# Patient Record
Sex: Female | Born: 2000
Health system: Southern US, Community
[De-identification: ages and names within clinical notes are randomized; demographics above are authoritative.]

## PROBLEM LIST (undated history)

## (undated) DIAGNOSIS — Z789 Other specified health status: Secondary | ICD-10-CM

## (undated) HISTORY — DX: Other specified health status: Z78.9

## (undated) HISTORY — PX: NO PAST SURGERIES: SHX2092

---
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
## 2019-02-16 ENCOUNTER — Telehealth: Payer: Self-pay | Admitting: Women's Health

## 2019-02-16 NOTE — Telephone Encounter (Signed)
Unable to reach pt or leave message with pt with restrictions for upcoming appt.

## 2019-02-17 ENCOUNTER — Ambulatory Visit: Payer: Self-pay | Admitting: Adult Health

## 2019-02-17 ENCOUNTER — Ambulatory Visit: Payer: Self-pay | Admitting: Women's Health

## 2019-02-23 ENCOUNTER — Other Ambulatory Visit: Payer: Self-pay

## 2019-02-23 ENCOUNTER — Ambulatory Visit (INDEPENDENT_AMBULATORY_CARE_PROVIDER_SITE_OTHER): Payer: 59 | Admitting: Adult Health

## 2019-02-23 ENCOUNTER — Encounter: Payer: Self-pay | Admitting: Adult Health

## 2019-02-23 VITALS — BP 126/78 | HR 76 | Ht 67.0 in | Wt 133.0 lb

## 2019-02-23 DIAGNOSIS — Z3041 Encounter for surveillance of contraceptive pills: Secondary | ICD-10-CM | POA: Diagnosis not present

## 2019-02-23 NOTE — Progress Notes (Signed)
  Subjective:     Patient ID: Tammy Odonnell, female   DOB: 02/01/2001, 18 y.o.   MRN: [id number]  HPI Fantasha is a 18 year old white female, single, G0P0, back in follow up on starting Lo Loestrin in July and she is happy with them.  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 PCP is Dr Hilma Favors.   Review of Systems Patient denies any headaches, hearing loss, fatigue, blurred vision, shortness of breath, chest pain, abdominal pain, problems with bowel movements, urination, or intercourse. No joint pain or mood swings.  Reviewed past medical,surgical, social and family history. Reviewed medications and allergies.     Objective:   Physical Exam BP 126/78 (BP Location: Right Arm, Patient Position: Sitting, Cuff Size: Normal)   Pulse 76   Ht 5\' 7"  (1.702 m)   Wt 133 lb (60.3 kg)   LMP 02/05/2019 (Approximate)   BMI 20.83 kg/m   Skin warm and dry.  Lungs: clear to ausculation bilaterally. Cardiovascular: regular rate and rhythm.  Fall risk is low. She had negative GC/CHL in July and declines STD testing.  Assessment:     1. Encounter for surveillance of contraceptive pills       Plan:     Will continue lo loestrin, has refills Follow up in about 8 months, or sooner if needed

## 2019-03-20 ENCOUNTER — Other Ambulatory Visit: Payer: Self-pay

## 2019-03-20 DIAGNOSIS — Z20822 Contact with and (suspected) exposure to covid-19: Secondary | ICD-10-CM

## 2019-03-23 LAB — NOVEL CORONAVIRUS, NAA: SARS-CoV-2, NAA: NOT DETECTED

## 2019-10-07 ENCOUNTER — Other Ambulatory Visit: Payer: Self-pay | Admitting: Women's Health

## 2019-11-25 ENCOUNTER — Ambulatory Visit: Payer: 59 | Admitting: Adult Health

## 2019-11-25 ENCOUNTER — Other Ambulatory Visit: Payer: Self-pay

## 2019-11-25 ENCOUNTER — Encounter: Payer: Self-pay | Admitting: Adult Health

## 2019-11-25 VITALS — BP 132/62 | HR 78 | Ht 67.0 in | Wt 125.0 lb

## 2019-11-25 DIAGNOSIS — Z3202 Encounter for pregnancy test, result negative: Secondary | ICD-10-CM

## 2019-11-25 DIAGNOSIS — Z30013 Encounter for initial prescription of injectable contraceptive: Secondary | ICD-10-CM

## 2019-11-25 DIAGNOSIS — Z113 Encounter for screening for infections with a predominantly sexual mode of transmission: Secondary | ICD-10-CM | POA: Diagnosis not present

## 2019-11-25 LAB — POCT URINE PREGNANCY: Preg Test, Ur: NEGATIVE

## 2019-11-25 MED ORDER — MEDROXYPROGESTERONE ACETATE 150 MG/ML IM SUSP: 150.0000 mg | INTRAMUSCULAR | 4 refills | Status: DC

## 2019-11-25 NOTE — Progress Notes (Signed)
  Subjective:     Patient ID: Tammy Odonnell, female   DOB: 2000/08/08, 19 y.o.   MRN: [id number]  HPI [name] is a 19 year old white female,single, G0P0, in to discuss starting depo, forgets the pill, so stopped it and uses condoms. And she requests STD testing.  PCP is Dr Phillips Odor.  Review of Systems  +sex with condoms Forgot to take the pill so stopped about 3 weeks ago,and wants depo   Reviewed past medical,surgical, social and family history. Reviewed medications and allergies.     Objective:   Physical Exam BP 132/62 (BP Location: Left Arm, Patient Position: Sitting, Cuff Size: Normal)   Pulse 78   Ht 5\' 7"  (1.702 m)   Wt 125 lb (56.7 kg)   LMP 11/02/2019 (Approximate)   BMI 19.58 kg/m UPT is negative.Skin warm and dry. Neck: mid line trachea, normal thyroid, good ROM, no lymphadenopathy noted. Lungs: clear to ausculation bilaterally. Cardiovascular: regular rate and rhythm.  Upstream - 11/25/19 1022      Pregnancy Intention Screening   Does the patient want to become pregnant in the next year? No    Does the patient's partner want to become pregnant in the next year? No    Would the patient like to discuss contraceptive options today? Yes      Contraception Wrap Up   Current Method Female Condom    End Method Hormonal Injection    Contraception Counseling Provided Yes             Assessment:     1. Pregnancy test negative  2. Screen for STD (sexually transmitted disease) GC/CHL on urine sent She declines HIV and RPR  3. Encounter for initial prescription of injectable contraceptive Discussed depo and she wants it.  Will rx depo,call when period starts for first injection, use condoms Meds ordered this encounter  Medications  . medroxyPROGESTERone (DEPO-PROVERA) 150 MG/ML injection    Sig: Inject 1 mL (150 mg total) into the muscle every 3 (three) months.    Dispense:  1 mL    Refill:  4    Order Specific Question:   Supervising Provider    Answer:   11/27/19 [[id number]]      Plan:     Use condoms Pt aware of Plan B Call with period for first depo injection

## 2019-11-27 LAB — GC/CHLAMYDIA PROBE AMP
Chlamydia trachomatis, NAA: NEGATIVE
Neisseria Gonorrhoeae by PCR: NEGATIVE

## 2019-12-09 ENCOUNTER — Ambulatory Visit (INDEPENDENT_AMBULATORY_CARE_PROVIDER_SITE_OTHER): Payer: 59 | Admitting: *Deleted

## 2019-12-09 DIAGNOSIS — Z308 Encounter for other contraceptive management: Secondary | ICD-10-CM | POA: Diagnosis not present

## 2019-12-09 DIAGNOSIS — Z3202 Encounter for pregnancy test, result negative: Secondary | ICD-10-CM | POA: Diagnosis not present

## 2019-12-09 LAB — POCT URINE PREGNANCY: Preg Test, Ur: NEGATIVE

## 2019-12-09 MED ORDER — MEDROXYPROGESTERONE ACETATE 150 MG/ML IM SUSP
150.0000 mg | Freq: Once | INTRAMUSCULAR | Status: AC
  Administered 2019-12-09: 150 mg via INTRAMUSCULAR

## 2019-12-09 NOTE — Progress Notes (Signed)
° °  NURSE VISIT- INJECTION  SUBJECTIVE:  Tammy Odonnell is a 19 y.o. G0P0000 female here for a Depo Provera for contraception/period management. She is a GYN patient.   OBJECTIVE:  There were no vitals taken for this visit.  Appears well, in no apparent distress  Injection administered in: Right deltoid  Meds ordered this encounter  Medications   medroxyPROGESTERone (DEPO-PROVERA) injection 150 mg    ASSESSMENT: GYN patient Depo Provera for contraception/period management PLAN: Follow-up: in 11-13 weeks for next Depo   Malachy Mood  12/09/2019 10:45 AM

## 2019-12-10 ENCOUNTER — Ambulatory Visit (INDEPENDENT_AMBULATORY_CARE_PROVIDER_SITE_OTHER): Payer: 59 | Admitting: Adult Health

## 2019-12-10 ENCOUNTER — Other Ambulatory Visit (HOSPITAL_COMMUNITY)
Admission: RE | Admit: 2019-12-10 | Discharge: 2019-12-10 | Disposition: A | Discharge status: Home / Self Care | Payer: 59 | Source: Ambulatory Visit | Attending: Adult Health | Admitting: Adult Health

## 2019-12-10 ENCOUNTER — Encounter: Payer: Self-pay | Admitting: Adult Health

## 2019-12-10 ENCOUNTER — Other Ambulatory Visit: Payer: Self-pay

## 2019-12-10 VITALS — BP 127/77 | HR 89 | Ht 67.0 in | Wt 124.0 lb

## 2019-12-10 DIAGNOSIS — N898 Other specified noninflammatory disorders of vagina: Secondary | ICD-10-CM

## 2019-12-10 NOTE — Progress Notes (Signed)
  Subjective:     Patient ID: Tammy Odonnell, female   DOB: 04/30/2001, 19 y.o.   MRN: [id number]  HPI Tammy Odonnell is a 19 year old white female, single, G0P0 in complaining of vaginal odor. PCP is Dr Phillips Odor.   Review of Systems Has vaginal odor,first noticed after UTI Has white vaginal discharge, some itching No new sex partners  Happy with Depo  Reviewed past medical,surgical, social and family history. Reviewed medications and allergies.     Objective:   Physical Exam BP 127/77 (BP Location: Left Arm, Patient Position: Sitting, Cuff Size: Normal)   Pulse 89   Ht 5\' 7"  (1.702 m)   Wt 124 lb (56.2 kg)   LMP 12/06/2019 (Exact Date)   BMI 19.42 kg/m  Skin warm and dry.Pelvic: external genitalia is normal in appearance no lesions, vagina: white discharge without odor,urethra has no lesions or masses noted, cervix:smooth, uterus: normal size, shape and contour, non tender, no masses felt, adnexa: no masses or tenderness noted. Bladder is non tender and no masses felt.   Upstream - 12/10/19 1147      Pregnancy Intention Screening   Does the patient want to become pregnant in the next year? No    Does the patient's partner want to become pregnant in the next year? No    Would the patient like to discuss contraceptive options today? No      Contraception Wrap Up   Current Method Hormonal Injection    End Method Hormonal Injection    Contraception Counseling Provided No         Examination chaperoned by 02/09/20 LPN    Assessment:     1. Vaginal odor, none today CV swab sent  2. Vaginal discharge CV swab sent     Plan:     CV swab sent, will talk when results back  Follow up prn

## 2019-12-11 ENCOUNTER — Telehealth: Payer: Self-pay | Admitting: Adult Health

## 2019-12-11 ENCOUNTER — Other Ambulatory Visit: Payer: Self-pay | Admitting: Adult Health

## 2019-12-11 LAB — CERVICOVAGINAL ANCILLARY ONLY
Bacterial Vaginitis (gardnerella): POSITIVE — AB
Candida Glabrata: NEGATIVE
Candida Vaginitis: POSITIVE — AB
Chlamydia: NEGATIVE
Comment: NEGATIVE
Comment: NEGATIVE
Comment: NEGATIVE
Comment: NEGATIVE
Comment: NEGATIVE
Comment: NORMAL
Neisseria Gonorrhea: NEGATIVE
Trichomonas: NEGATIVE

## 2019-12-11 MED ORDER — METRONIDAZOLE 500 MG PO TABS: 500.0000 mg | ORAL_TABLET | Freq: Two times a day (BID) | ORAL | 0 refills | Status: DC

## 2019-12-11 MED ORDER — FLUCONAZOLE 150 MG PO TABS: ORAL_TABLET | ORAL | 1 refills | Status: DC

## 2019-12-11 NOTE — Progress Notes (Signed)
rx flagyl and diflucan  

## 2019-12-11 NOTE — Telephone Encounter (Signed)
Left message that vaginal swab +BV and yeast, will rx flagyl and diflucan

## 2019-12-29 ENCOUNTER — Other Ambulatory Visit: Payer: Self-pay | Admitting: Internal Medicine

## 2019-12-29 ENCOUNTER — Other Ambulatory Visit (HOSPITAL_COMMUNITY): Payer: Self-pay | Admitting: Internal Medicine

## 2019-12-29 DIAGNOSIS — N390 Urinary tract infection, site not specified: Secondary | ICD-10-CM

## 2020-01-25 ENCOUNTER — Other Ambulatory Visit: Payer: Self-pay

## 2020-01-25 ENCOUNTER — Ambulatory Visit (HOSPITAL_COMMUNITY)
Admission: RE | Admit: 2020-01-25 | Discharge: 2020-01-25 | Disposition: A | Discharge status: Home / Self Care | Payer: 59 | Source: Ambulatory Visit | Attending: Internal Medicine | Admitting: Internal Medicine

## 2020-01-25 ENCOUNTER — Encounter (HOSPITAL_COMMUNITY): Payer: Self-pay

## 2020-01-25 DIAGNOSIS — N390 Urinary tract infection, site not specified: Secondary | ICD-10-CM | POA: Insufficient documentation

## 2020-01-25 MED ORDER — IOHEXOL 300 MG/ML  SOLN
75.0000 mL | Freq: Once | INTRAMUSCULAR | Status: AC | PRN
  Administered 2020-01-25: 75 mL via INTRAVENOUS

## 2020-01-25 MED ORDER — IOHEXOL 300 MG/ML  SOLN: 100.0000 mL | Freq: Once | INTRAMUSCULAR | Status: DC | PRN

## 2020-03-02 ENCOUNTER — Ambulatory Visit: Payer: 59

## 2020-03-07 ENCOUNTER — Encounter: Payer: Self-pay | Admitting: Advanced Practice Midwife

## 2020-03-07 ENCOUNTER — Ambulatory Visit: Payer: 59 | Admitting: Advanced Practice Midwife

## 2020-03-07 VITALS — BP 121/72 | HR 66 | Ht 67.0 in | Wt 128.5 lb

## 2020-03-07 DIAGNOSIS — N898 Other specified noninflammatory disorders of vagina: Secondary | ICD-10-CM | POA: Diagnosis not present

## 2020-03-07 DIAGNOSIS — Z3202 Encounter for pregnancy test, result negative: Secondary | ICD-10-CM

## 2020-03-07 LAB — POCT URINE PREGNANCY: Preg Test, Ur: NEGATIVE

## 2020-03-07 NOTE — Progress Notes (Signed)
[facility] Visit  Patient name: Tammy Odonnell MRN [id number]  Date of birth: 22-Jun-2000  CC & HPI:  Tammy Odonnell is a 19 y.o.  female presenting today for (maybe) switching birth control.  Was on COCs until switching to depo on 8/4 because she had trouble remembering daily pills.  Wants to switch d/t feeling like her vagina is dry all the time, not just during intercourse. Noticed is some w/COCs, but not as bad as now. Doesn't want a LARC, prefers to stay on depo if she can fix the vaginal dryness.     Pertinent History Reviewed:  Medical & Surgical Hx:   History reviewed. No pertinent past medical history. History reviewed. No pertinent surgical history. Family History  Problem Relation Age of Onset  . Diabetes Maternal Grandmother    No current outpatient medications on file. Social History: Reviewed -  reports that she has never smoked. She has never used smokeless tobacco.  Review of Systems:   Constitutional: Negative for fever and chills Eyes: Negative for visual disturbances Respiratory: Negative for shortness of breath, dyspnea Cardiovascular: Negative for chest pain or palpitations  Gastrointestinal: Negative for vomiting, diarrhea and constipation; no abdominal pain Genitourinary: Negative for dysuria and urgency, vaginal irritation or itching Musculoskeletal: Negative for back pain, joint pain, myalgias  Neurological: Negative for dizziness and headaches    Objective Findings:    Physical Examination: Vitals:   03/07/20 1429  BP: 121/72  Pulse: 66   General appearance - well appearing, and in no distress Mental status - alert, oriented to person, place, and time Chest:  Normal respiratory effort Heart - normal rate and regular rhythm Abdomen:  Soft, nontender Pelvic: deferred Musculoskeletal:  Normal range of motion without pain Extremities:  No edema    Results for orders placed or performed in visit on 03/07/20 (from the past 24  hour(s))  POCT urine pregnancy   Collection Time: 03/07/20  2:38 PM  Result Value Ref Range   Preg Test, Ur Negative Negative      Assessment & Plan:  A:   Vaginal Dryness 2/2 depo P:  Try a vaginal moisturizer 3-4 x/week.  Last day for depo (15 weeks) is 11/17 (call when she knows that the vaginal moisturizer is or isn't going to work).  If not, will rx COCs via mychart.    No follow-ups on file.  Jacklyn Shell CNM 03/07/2020 2:46 PM

## 2020-03-11 ENCOUNTER — Ambulatory Visit (INDEPENDENT_AMBULATORY_CARE_PROVIDER_SITE_OTHER): Payer: 59 | Admitting: Urology

## 2020-03-11 ENCOUNTER — Encounter: Payer: Self-pay | Admitting: Urology

## 2020-03-11 ENCOUNTER — Other Ambulatory Visit: Payer: Self-pay

## 2020-03-11 VITALS — BP 126/74 | HR 69 | Temp 98.8°F | Ht 67.0 in | Wt 130.0 lb

## 2020-03-11 DIAGNOSIS — N390 Urinary tract infection, site not specified: Secondary | ICD-10-CM | POA: Diagnosis not present

## 2020-03-11 LAB — URINALYSIS, ROUTINE W REFLEX MICROSCOPIC
Bilirubin, UA: NEGATIVE
Glucose, UA: NEGATIVE
Ketones, UA: NEGATIVE
Leukocytes,UA: NEGATIVE
Nitrite, UA: NEGATIVE
Specific Gravity, UA: 1.02 (ref 1.005–1.030)
Urobilinogen, Ur: 0.2 mg/dL (ref 0.2–1.0)
pH, UA: 7 (ref 5.0–7.5)

## 2020-03-11 LAB — MICROSCOPIC EXAMINATION
Epithelial Cells (non renal): >10 /hpf — AB (ref 0–10)
Renal Epithel, UA: NONE SEEN /hpf

## 2020-03-11 MED ORDER — NITROFURANTOIN MACROCRYSTAL 50 MG PO CAPS: 50.0000 mg | ORAL_CAPSULE | ORAL | 1 refills | Status: DC | PRN

## 2020-03-11 NOTE — Patient Instructions (Signed)

## 2020-03-11 NOTE — Progress Notes (Signed)
   03/11/2020 1:28 PM   Tammy Odonnell 08/28/2000 [id number]  Referring provider: Assunta Found, MD [address] [place],  [place] [address]  Recurrent UTI  HPI: Tammy Odonnell is a Irena Reichmann here for evaluation of recurrent UTI. Starting in June 2021 she developed 5 UTIs. She gets her UTis within 1-2 days of sexual intercourse. She denies any hx iof childhood UTIs. NO hematuria. No significant LUTS. She underwent CT 01/25/2020 which showed no GU abnormalities.  UA today shows RBCs, WBCs, and moderate bacteria   PMH: Past Medical History:  Diagnosis Date  . No pertinent past medical history     Surgical History: Past Surgical History:  Procedure Laterality Date  . NO PAST SURGERIES      Home Medications:  Allergies as of 03/11/2020   No Known Allergies     Medication List    as of March 11, 2020  1:28 PM   You have not been prescribed any medications.     Allergies: No Known Allergies  Family History: Family History  Problem Relation Age of Onset  . Diabetes Maternal Grandmother     Social History:  reports that she has never smoked. She has never used smokeless tobacco. She reports previous alcohol use. She reports previous drug use.  ROS: All other review of systems were reviewed and are negative except what is noted above in HPI  Physical Exam: BP 126/74   Pulse 69   Temp 98.8 F (37.1 C)   Ht 5\' 7"  (1.702 m)   Wt 130 lb (59 kg)   BMI 20.36 kg/m   Constitutional:  Alert and oriented, No acute distress. HEENT: [place] AT, moist mucus membranes.  Trachea midline, no masses. Cardiovascular: No clubbing, cyanosis, or edema. Respiratory: Normal respiratory effort, no increased work of breathing. GI: Abdomen is soft, nontender, nondistended, no abdominal masses GU: No CVA tenderness.  Lymph: No cervical or inguinal lymphadenopathy. Skin: No rashes, bruises or suspicious lesions. Neurologic: Grossly intact, no focal deficits, moving all 4  extremities. Psychiatric: Normal mood and affect.  Laboratory Data: Lab Results  Component Value Date   WBC 7.8 02/07/2007   HGB 11.1 02/07/2007   HCT 32.7 (L) 02/07/2007   MCV 81.5 02/07/2007   PLT 388 02/07/2007    No results found for: CREATININE  No results found for: PSA  No results found for: TESTOSTERONE  No results found for: HGBA1C  Urinalysis No results found for: COLORURINE, APPEARANCEUR, LABSPEC, PHURINE, GLUCOSEU, HGBUR, BILIRUBINUR, KETONESUR, PROTEINUR, UROBILINOGEN, NITRITE, LEUKOCYTESUR  No results found for: LABMICR, WBCUA, RBCUA, LABEPIT, MUCUS, BACTERIA  Pertinent Imaging:  No results found for this or any previous visit.  No results found for this or any previous visit.  No results found for this or any previous visit.  No results found for this or any previous visit.  No results found for this or any previous visit.  No results found for this or any previous visit.  No results found for this or any previous visit.  No results found for this or any previous visit.   Assessment & Plan:    1. Recurrent UTI: --We discussed the natural hx of recurrent UTIs and the various causes. We discussed the treatment options including post coital prophylaxis, daily prophylaxis, topical estrogen therapy. No follow-ups on file.  04/09/2007, MD  [facility] [place]

## 2020-03-11 NOTE — Progress Notes (Signed)
Urological Symptom Review ° °Patient is experiencing the following symptoms: °Urinary tract infection ° ° °Review of Systems ° °Gastrointestinal (upper)  : °Negative for upper GI symptoms ° °Gastrointestinal (lower) : °Negative for lower GI symptoms ° °Constitutional : °Negative for symptoms ° °Skin: °Negative for skin symptoms ° °Eyes: °Negative for eye symptoms ° °Ear/Nose/Throat : °Negative for Ear/Nose/Throat symptoms ° °Hematologic/Lymphatic: °Negative for Hematologic/Lymphatic symptoms ° °Cardiovascular : °Negative for cardiovascular symptoms ° °Respiratory : °Negative for respiratory symptoms ° °Endocrine: °Negative for endocrine symptoms ° °Musculoskeletal: °Negative for musculoskeletal symptoms ° °Neurological: °Negative for neurological symptoms ° °Psychologic: °Negative for psychiatric symptoms °

## 2020-03-13 LAB — URINE CULTURE: Organism ID, Bacteria: NO GROWTH

## 2020-03-14 NOTE — Progress Notes (Signed)
Result sent via mychart.   

## 2020-03-24 ENCOUNTER — Other Ambulatory Visit: Payer: Self-pay

## 2020-03-24 ENCOUNTER — Other Ambulatory Visit: Payer: 59

## 2020-03-24 ENCOUNTER — Ambulatory Visit (INDEPENDENT_AMBULATORY_CARE_PROVIDER_SITE_OTHER): Payer: 59 | Admitting: *Deleted

## 2020-03-24 DIAGNOSIS — Z308 Encounter for other contraceptive management: Secondary | ICD-10-CM | POA: Diagnosis not present

## 2020-03-24 DIAGNOSIS — Z3042 Encounter for surveillance of injectable contraceptive: Secondary | ICD-10-CM

## 2020-03-24 LAB — BETA HCG QUANT (REF LAB): hCG Quant: <1 m[IU]/mL

## 2020-03-24 MED ORDER — MEDROXYPROGESTERONE ACETATE 150 MG/ML IM SUSP
150.0000 mg | Freq: Once | INTRAMUSCULAR | Status: AC
  Administered 2020-03-24: 150 mg via INTRAMUSCULAR

## 2020-03-24 NOTE — Progress Notes (Signed)
   NURSE VISIT- INJECTION  SUBJECTIVE:  Tammy Odonnell is a 19 y.o. G0P0000 female here for a Depo Provera for contraception/period management. She is a GYN patient. Pt had a negative quant this am.   OBJECTIVE:  There were no vitals taken for this visit.  Appears well, in no apparent distress  Injection administered in: Left deltoid  Meds ordered this encounter  Medications  . medroxyPROGESTERone (DEPO-PROVERA) injection 150 mg    ASSESSMENT: GYN patient Depo Provera for contraception/period management PLAN: Follow-up: in 11-13 weeks for next Depo   Malachy Mood  03/24/2020 2:51 PM

## 2020-06-16 ENCOUNTER — Ambulatory Visit (INDEPENDENT_AMBULATORY_CARE_PROVIDER_SITE_OTHER): Payer: 59 | Admitting: *Deleted

## 2020-06-16 ENCOUNTER — Other Ambulatory Visit: Payer: Self-pay

## 2020-06-16 DIAGNOSIS — Z3042 Encounter for surveillance of injectable contraceptive: Secondary | ICD-10-CM

## 2020-06-16 MED ORDER — MEDROXYPROGESTERONE ACETATE 150 MG/ML IM SUSP
150.0000 mg | Freq: Once | INTRAMUSCULAR | Status: AC
Start: 2020-06-16 — End: 2020-06-16
  Administered 2020-06-16: 150 mg via INTRAMUSCULAR

## 2020-06-16 NOTE — Progress Notes (Signed)
   NURSE VISIT- INJECTION  SUBJECTIVE:  Tammy Odonnell is a 20 y.o. G0P0000 female here for a Depo Provera for contraception/period management. She is a GYN patient.   OBJECTIVE:  There were no vitals taken for this visit.  Appears well, in no apparent distress  Injection administered in: Right deltoid  No orders of the defined types were placed in this encounter.   ASSESSMENT: GYN patient Depo Provera for contraception/period management PLAN: Follow-up: in 11-13 weeks for next Depo   Jobe Marker  06/16/2020 9:47 AM

## 2020-09-06 NOTE — Progress Notes (Signed)
   GYN VISIT Patient name: Tammy Odonnell MRN [id number]  Date of birth: 01/11/2001 Chief Complaint:   No chief complaint on file.  History of Present Illness:   Tammy Odonnell is a 20 y.o. G0P0000  female being seen today for the following concerns:  -Contraception: Previously on COCs, but due to compliance concerns switched to Depot.  Doing ok, but had noted vaginal dryness and was advised to try a moisturizer.  Today she notes that she is still having the  dryness and worsening UTIs.  She has done some research on her own- considering either patch or IUD  No LMP recorded. Patient has had an injection.  No flowsheet data found.   Review of Systems:   Pertinent items are noted in HPI Denies fever/chills, dizziness, headaches, visual disturbances, fatigue, shortness of breath, chest pain, abdominal pain, vomiting, no problems with periods, bowel movements, urination, or intercourse unless otherwise stated above.  Pertinent History Reviewed:  Reviewed past medical,surgical, social, obstetrical and family history.  Reviewed problem list, medications and allergies. Physical Assessment:  There were no vitals filed for this visit.There is no height or weight on file to calculate BMI.       Physical Examination:   General appearance: alert, well appearing, and in no distress  Psych: mood appropriate, normal affect  Skin: warm & dry   Cardiovascular: normal heart rate noted  Respiratory: normal respiratory effort, no distress  Extremities: no edema   Chaperone: N/A    Assessment & Plan:  1) Contraception -reviewed pros/cons of patch and IUD.  After discussion, pt desires trial of patch- Rx sent in.  Pt to f/u as needed or 29yr for annual  Myna Hidalgo, DO Attending Obstetrician & Gynecologist, [facility] for [employer], [facility]

## 2020-09-08 ENCOUNTER — Ambulatory Visit: Payer: 59

## 2020-09-08 ENCOUNTER — Ambulatory Visit (INDEPENDENT_AMBULATORY_CARE_PROVIDER_SITE_OTHER): Payer: 59 | Admitting: Obstetrics & Gynecology

## 2020-09-08 ENCOUNTER — Encounter: Payer: Self-pay | Admitting: Obstetrics & Gynecology

## 2020-09-08 ENCOUNTER — Other Ambulatory Visit: Payer: Self-pay

## 2020-09-08 VITALS — BP 127/71 | HR 89 | Ht 67.0 in | Wt 144.4 lb

## 2020-09-08 DIAGNOSIS — Z30016 Encounter for initial prescription of transdermal patch hormonal contraceptive device: Secondary | ICD-10-CM

## 2020-09-08 MED ORDER — TWIRLA 120-30 MCG/24HR TD PTWK: 1.0000 | MEDICATED_PATCH | TRANSDERMAL | 4 refills | Status: DC

## 2020-09-14 ENCOUNTER — Other Ambulatory Visit: Payer: Self-pay | Admitting: Obstetrics & Gynecology

## 2020-09-14 ENCOUNTER — Telehealth: Payer: Self-pay | Admitting: *Deleted

## 2020-09-14 MED ORDER — XULANE 150-35 MCG/24HR TD PTWK: 1.0000 | MEDICATED_PATCH | TRANSDERMAL | 12 refills | Status: DC

## 2020-09-14 NOTE — Telephone Encounter (Signed)
Pt's insurance has denied covering Twirla patches. Pt needs to try and fail formulary alternatives. JSY

## 2020-09-14 NOTE — Telephone Encounter (Signed)
Left message @ 4:47 pm letting pt know her insurance has denied covering Twirla patches. An alternative patch has been sent to pharmacy. JSY

## 2020-09-14 NOTE — Progress Notes (Signed)
New Rx sent in due to lack of presceription coverage

## 2020-09-20 ENCOUNTER — Telehealth: Payer: Self-pay

## 2020-09-20 ENCOUNTER — Other Ambulatory Visit: Payer: Self-pay | Admitting: Obstetrics & Gynecology

## 2020-09-20 MED ORDER — DESOGESTREL-ETHINYL ESTRADIOL 0.15-30 MG-MCG PO TABS: 1.0000 | ORAL_TABLET | Freq: Every day | ORAL | 11 refills | Status: DC

## 2020-09-20 NOTE — Telephone Encounter (Signed)
Patient calling she states she had a script for birth control patches called into her pharmacy but she would rather have the pill ph# [phone]

## 2020-09-20 NOTE — Telephone Encounter (Signed)
Pt has not picked up [facility] patches and changed her mind about them. She would prefer BC pills instead. Dr Charlotta Newton out of the office til next week.

## 2020-11-28 IMAGING — CT CT ABD-PELV W/ CM
2 of 4 series · 16 of 46 positions shown, 18 images · IV contrast (Omnipaque or Isovue)
Comparison: None.

CLINICAL DATA: Recurrent urinary tract infections.

EXAM:
CT ABDOMEN AND PELVIS WITH CONTRAST
TECHNIQUE: Multidetector CT imaging of the abdomen and pelvis was performed
using the standard protocol following bolus administration of
intravenous contrast.
CONTRAST:  75mL OMNIPAQUE IOHEXOL 300 MG/ML  SOLN

[Series 2: axial st · axial · 0.57mm/px · z∈[+914,+1324]mm · 13 of 90 slices shown, 15 images]
[im 4/90  soft-tissue]
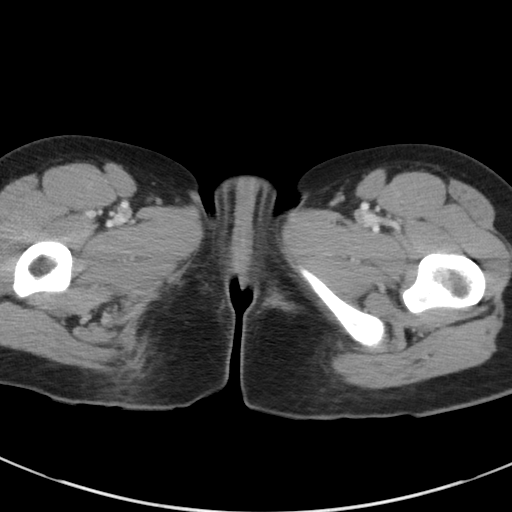
[im 4/90  bone]
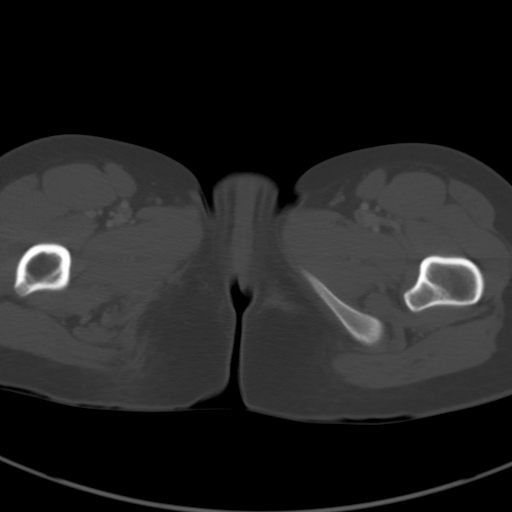
[im 11/90  soft-tissue]
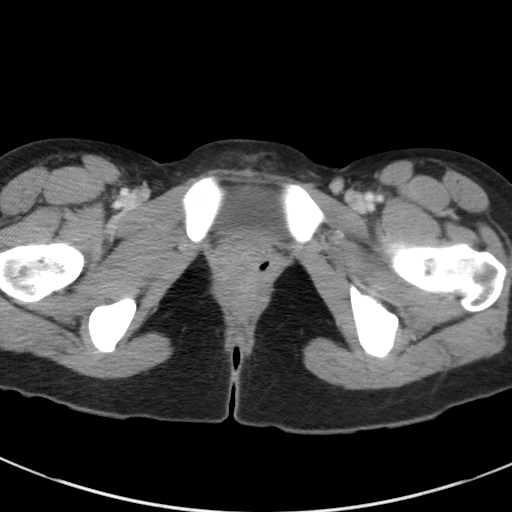
[im 18/90  soft-tissue]
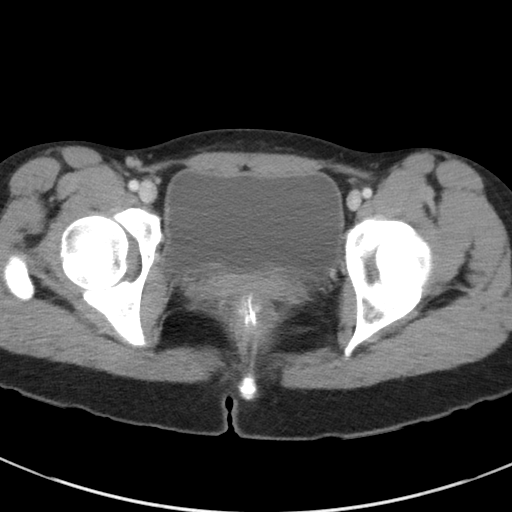
[im 25/90  soft-tissue]
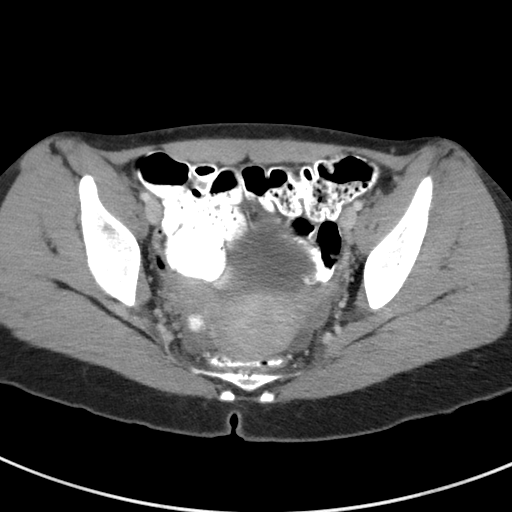
[im 33/90  soft-tissue]
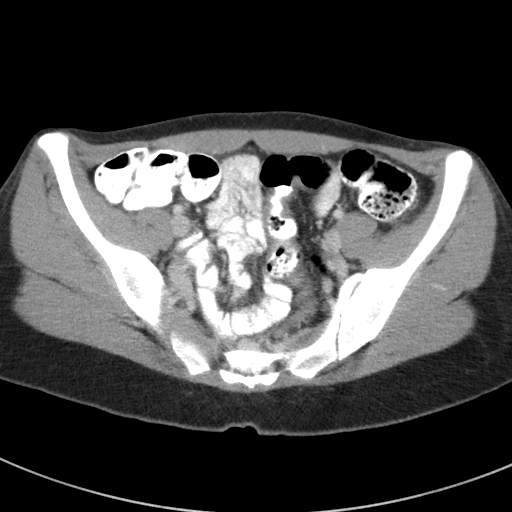
[im 40/90  soft-tissue]
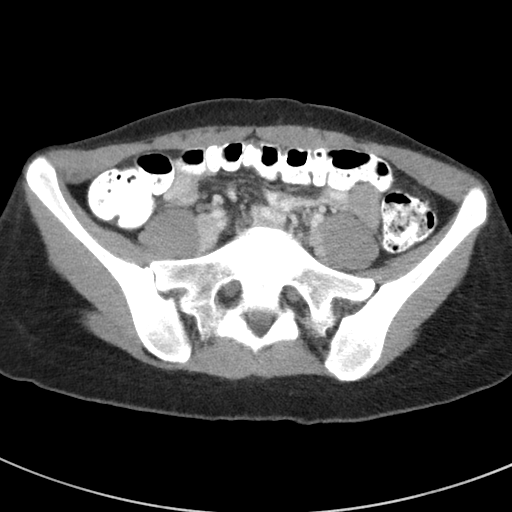
[im 47/90  soft-tissue]
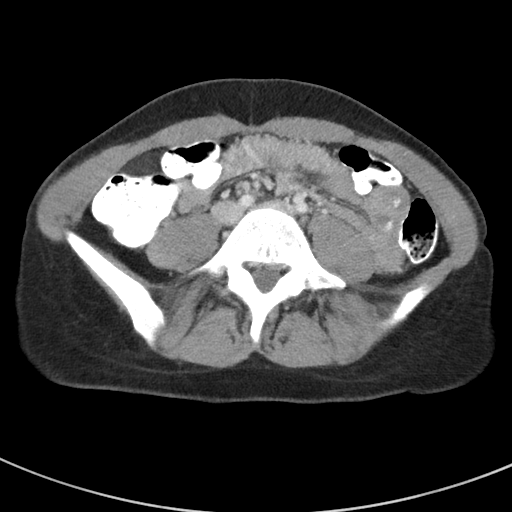
[im 50/90  soft-tissue]
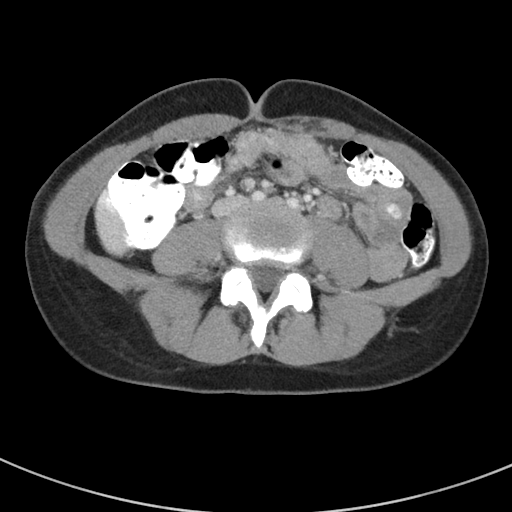
[im 57/90  soft-tissue]
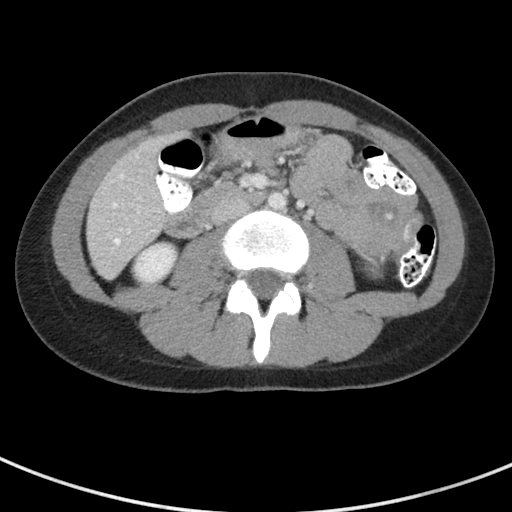
[im 57/90  bone]
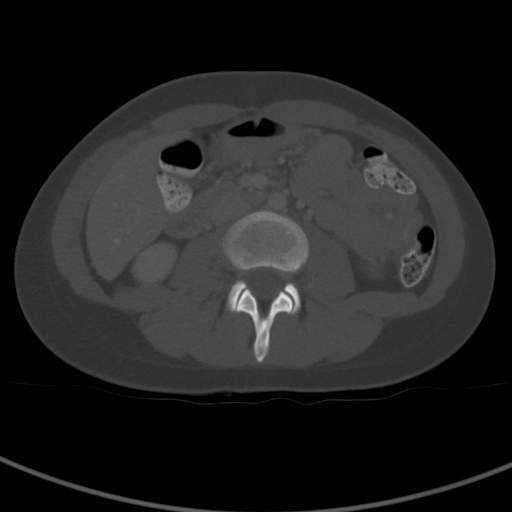
[im 65/90  soft-tissue]
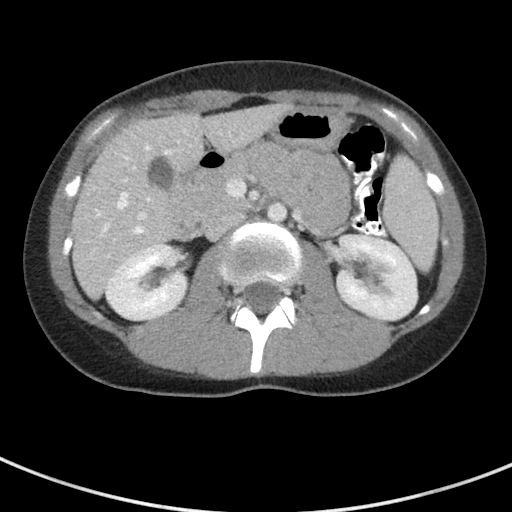
[im 72/90  soft-tissue]
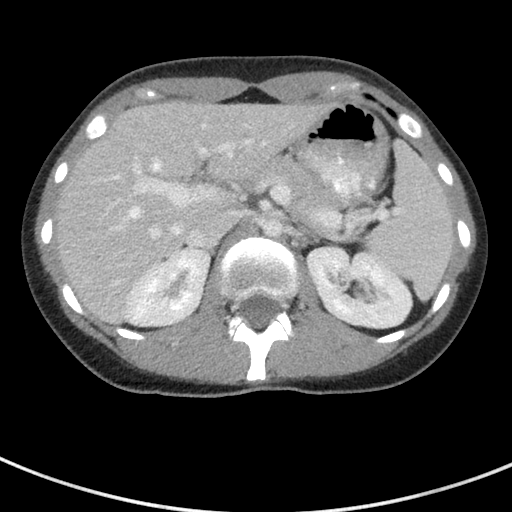
[im 79/90  soft-tissue]
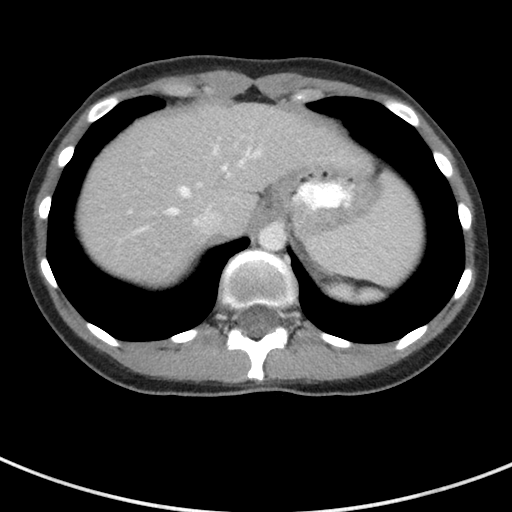
[im 86/90  soft-tissue]
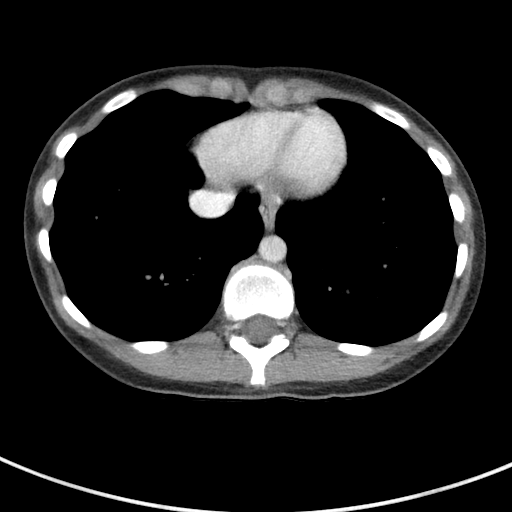

[Series 5: coronal st · coronal · 0.61mm/px · 3 of 71 slices shown]
[im 24/71  soft-tissue]
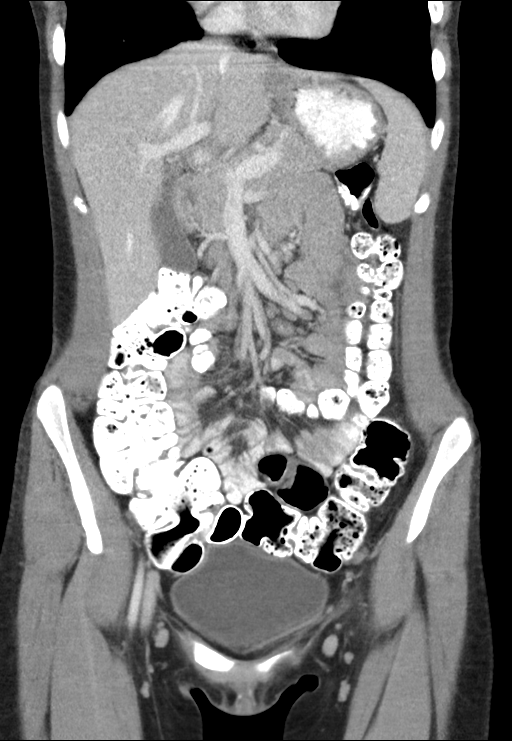
[im 32/71  soft-tissue]
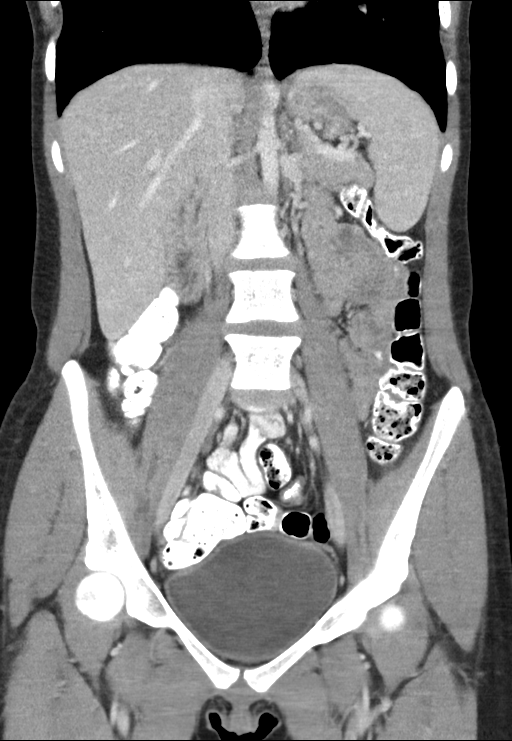
[im 39/71  soft-tissue]
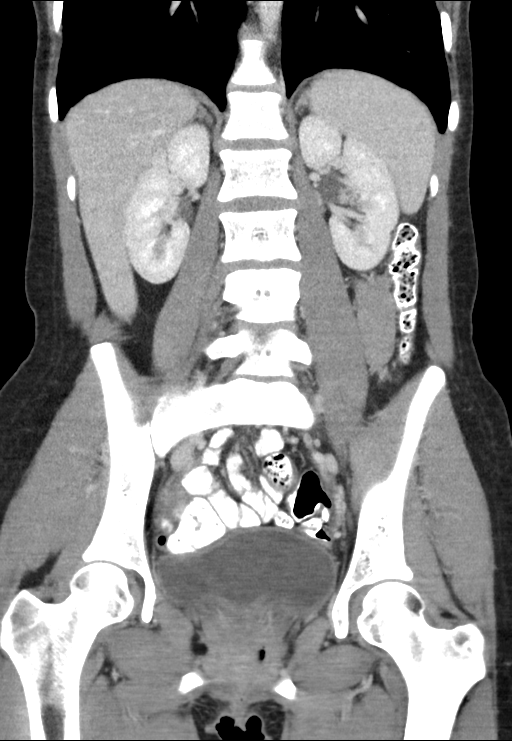

[16 of 46 positions shown; findings below may reference images not displayed]

FINDINGS: Lower Chest: No acute findings.

Hepatobiliary: No hepatic masses identified. Gallbladder is
unremarkable. No evidence of biliary ductal dilatation.

Pancreas:  No mass or inflammatory changes.

Spleen: Within normal limits in size and appearance.

Adrenals/Urinary Tract: Normal appearance both kidneys and both
adrenal glands. No masses identified. No evidence of ureteral
calculi or hydronephrosis. Unremarkable unopacified urinary bladder.

Stomach/Bowel: No evidence of obstruction, inflammatory process or
abnormal fluid collections. Normal appendix visualized.

Vascular/Lymphatic: No pathologically enlarged lymph nodes. No
abdominal aortic aneurysm.

Reproductive:  No mass or other significant abnormality.

Other:  None.

Musculoskeletal:  No suspicious bone lesions identified.
IMPRESSION: Negative. No acute findings or other significant abnormality.

## 2021-02-20 ENCOUNTER — Ambulatory Visit
Admission: EM | Admit: 2021-02-20 | Discharge: 2021-02-20 | Disposition: A | Discharge status: Home / Self Care | Payer: 59 | Attending: Student | Admitting: Student

## 2021-02-20 ENCOUNTER — Encounter: Payer: Self-pay | Admitting: Emergency Medicine

## 2021-02-20 DIAGNOSIS — N3001 Acute cystitis with hematuria: Secondary | ICD-10-CM | POA: Insufficient documentation

## 2021-02-20 LAB — POCT URINALYSIS DIP (MANUAL ENTRY)
Bilirubin, UA: NEGATIVE
Glucose, UA: NEGATIVE mg/dL
Ketones, POC UA: NEGATIVE mg/dL
Nitrite, UA: NEGATIVE
Protein Ur, POC: NEGATIVE mg/dL
Spec Grav, UA: <=1.005 — AB (ref 1.010–1.025)
Urobilinogen, UA: 0.2 E.U./dL
pH, UA: 6 (ref 5.0–8.0)

## 2021-02-20 MED ORDER — CEPHALEXIN 500 MG PO CAPS: 500.0000 mg | ORAL_CAPSULE | Freq: Four times a day (QID) | ORAL | 0 refills | Status: DC

## 2021-02-20 NOTE — ED Triage Notes (Signed)
Lower abd pain and pain on urination that started last night.

## 2021-02-20 NOTE — Discharge Instructions (Addendum)
-  Start the antibiotic: Keflex, 4x daily x5 days. You can take this with food if you have a sensitive stomach. -Drink plenty of water!  

## 2021-02-20 NOTE — ED Provider Notes (Signed)
[facility]    CSN: [id number] Arrival date & time: 02/20/21  0803      History   Chief Complaint No chief complaint on file.   HPI Tammy Odonnell is a 20 y.o. female presenting with urinary symptoms for about 1 day.  Medical history recurrent UTI, she is unsure of the last 1.  Here today with mom.  They note suprapubic pressure and dysuria for about 1 day, consistent with past UTI symptoms. Denies hematuria, frequency, urgency, back pain, n/v/d, fevers/chills, abdnormal vaginal discharge. Denies STI risk/vaginal sx.   HPI  Past Medical History:  Diagnosis Date   No pertinent past medical history     Patient Active Problem List   Diagnosis Date Noted   Vaginal discharge 12/10/2019   Vaginal odor 12/10/2019   Encounter for initial prescription of injectable contraceptive 11/25/2019   Screen for STD (sexually transmitted disease) 11/25/2019   Pregnancy test negative 11/25/2019   Encounter for surveillance of contraceptive pills 02/23/2019    Past Surgical History:  Procedure Laterality Date   NO PAST SURGERIES      OB History     Gravida  0   Para  0   Term  0   Preterm  0   AB  0   Living  0      SAB  0   IAB  0   Ectopic  0   Multiple  0   Live Births  0            Home Medications    Prior to Admission medications   Medication Sig Start Date End Date Taking? Authorizing Provider  cephALEXin (KEFLEX) 500 MG capsule Take 1 capsule (500 mg total) by mouth 4 (four) times daily. 02/20/21  Yes Rhys Martini, PA-C    Family History Family History  Problem Relation Age of Onset   Diabetes Maternal Grandmother     Social History Social History   Tobacco Use   Smoking status: Never   Smokeless tobacco: Never  Vaping Use   Vaping Use: Every day  Substance Use Topics   Alcohol use: Not Currently   Drug use: Not Currently     Allergies   Patient has no known allergies.   Review of Systems Review of Systems   Constitutional:  Negative for appetite change, chills, diaphoresis and fever.  Respiratory:  Negative for shortness of breath.   Cardiovascular:  Negative for chest pain.  Gastrointestinal:  Positive for abdominal pain. Negative for blood in stool, constipation, diarrhea, nausea and vomiting.  Genitourinary:  Positive for dysuria. Negative for decreased urine volume, difficulty urinating, flank pain, frequency, genital sores, hematuria and urgency.  Musculoskeletal:  Negative for back pain.  Neurological:  Negative for dizziness, weakness and light-headedness.  All other systems reviewed and are negative.   Physical Exam Triage Vital Signs ED Triage Vitals [02/20/21 0815]  Enc Vitals Group     BP (!) 153/90     Pulse Rate 77     Resp 18     Temp 98.6 F (37 C)     Temp Source Oral     SpO2 99 %     Weight      Height      Head Circumference      Peak Flow      Pain Score 7     Pain Loc      Pain Edu?      Excl. in GC?  No data found.  Updated Vital Signs BP (!) 153/90 (BP Location: Right Arm)   Pulse 77   Temp 98.6 F (37 C) (Oral)   Resp 18   LMP 02/01/2021 (Exact Date)   SpO2 99%   Visual Acuity Right Eye Distance:   Left Eye Distance:   Bilateral Distance:    Right Eye Near:   Left Eye Near:    Bilateral Near:     Physical Exam Vitals reviewed.  Constitutional:      General: She is not in acute distress.    Appearance: Normal appearance. She is not ill-appearing.  HENT:     Head: Normocephalic and atraumatic.     Mouth/Throat:     Mouth: Mucous membranes are moist.     Comments: Moist mucous membranes Eyes:     Extraocular Movements: Extraocular movements intact.     Pupils: Pupils are equal, round, and reactive to light.  Cardiovascular:     Rate and Rhythm: Normal rate and regular rhythm.     Heart sounds: Normal heart sounds.  Pulmonary:     Effort: Pulmonary effort is normal.     Breath sounds: Normal breath sounds. No wheezing, rhonchi  or rales.  Abdominal:     General: Bowel sounds are normal. There is no distension.     Palpations: Abdomen is soft. There is no mass.     Tenderness: There is abdominal tenderness in the suprapubic area. There is no right CVA tenderness, left CVA tenderness, guarding or rebound.  Skin:    General: Skin is warm.     Capillary Refill: Capillary refill takes less than 2 seconds.     Comments: Good skin turgor  Neurological:     General: No focal deficit present.     Mental Status: She is alert and oriented to person, place, and time.  Psychiatric:        Mood and Affect: Mood normal.        Behavior: Behavior normal.     UC Treatments / Results  Labs (all labs ordered are listed, but only abnormal results are displayed) Labs Reviewed  POCT URINALYSIS DIP (MANUAL ENTRY) - Abnormal; Notable for the following components:      Result Value   Spec Grav, UA <=1.005 (*)    Blood, UA moderate (*)    Leukocytes, UA Large (3+) (*)    All other components within normal limits  URINE CULTURE    EKG   Radiology No results found.  Procedures Procedures (including critical care time)  Medications Ordered in UC Medications - No data to display  Initial Impression / Assessment and Plan / UC Course  I have reviewed the triage vital signs and the nursing notes.  Pertinent labs & imaging results that were available during my care of the patient were reviewed by me and considered in my medical decision making (see chart for details).     This patient is a very pleasant 20 y.o. year old female presenting with acute cystitis. Afebrile, nontachycardic, suprapubic pressure but no CVAT.  Recurrent UTIs though she cannot recall the last one. UA with moderate blood, large leuk, negative nitrite. Culture sent Denies vaginal sx Injection for contraception. States she is not pregnant or breastfeeding.  Keflex as below. Good hydration.  ED return precautions discussed. Patient verbalizes  understanding and agreement.   Final Clinical Impressions(s) / UC Diagnoses   Final diagnoses:  Acute cystitis with hematuria     Discharge Instructions      -  Start the antibiotic: Keflex, 4x daily x5 days. You can take this with food if you have a sensitive stomach. -Drink plenty of water     ED Prescriptions     Medication Sig Dispense Auth. Provider   cephALEXin (KEFLEX) 500 MG capsule Take 1 capsule (500 mg total) by mouth 4 (four) times daily. 20 capsule Rhys Martini, PA-C      PDMP not reviewed this encounter.   Rhys Martini, PA-C 02/20/21 [phone]

## 2021-02-21 LAB — URINE CULTURE: Culture: <10000 — AB

## 2021-04-03 ENCOUNTER — Encounter: Payer: Self-pay | Admitting: Urology

## 2021-04-03 ENCOUNTER — Ambulatory Visit: Payer: 59 | Admitting: Urology

## 2021-04-03 ENCOUNTER — Telehealth: Payer: Self-pay

## 2021-04-03 ENCOUNTER — Other Ambulatory Visit: Payer: Self-pay

## 2021-04-03 VITALS — BP 125/77 | HR 105

## 2021-04-03 DIAGNOSIS — N390 Urinary tract infection, site not specified: Secondary | ICD-10-CM | POA: Diagnosis not present

## 2021-04-03 LAB — URINALYSIS, ROUTINE W REFLEX MICROSCOPIC
Bilirubin, UA: NEGATIVE
Glucose, UA: NEGATIVE
Ketones, UA: NEGATIVE
Leukocytes,UA: NEGATIVE
Nitrite, UA: NEGATIVE
Protein,UA: NEGATIVE
RBC, UA: NEGATIVE
Specific Gravity, UA: 1.015 (ref 1.005–1.030)
Urobilinogen, Ur: 0.2 mg/dL (ref 0.2–1.0)
pH, UA: 7 (ref 5.0–7.5)

## 2021-04-03 LAB — BLADDER SCAN AMB NON-IMAGING: Scan Result: 1

## 2021-04-03 MED ORDER — NITROFURANTOIN MACROCRYSTAL 50 MG PO CAPS: 50.0000 mg | ORAL_CAPSULE | Freq: Every day | ORAL | 11 refills | Status: DC

## 2021-04-03 NOTE — Patient Instructions (Signed)

## 2021-04-03 NOTE — Telephone Encounter (Signed)
Patient advised at checkout she would call back once she received her work schedule for the upcoming appt 03/2022.

## 2021-04-03 NOTE — Progress Notes (Signed)
post void residual=1  Urological Symptom Review  Patient is experiencing the following symptoms: Frequent urination Burning/pain with urination Urinary tract infection   Review of Systems  Gastrointestinal (upper)  : Negative for upper GI symptoms  Gastrointestinal (lower) : Negative for lower GI symptoms  Constitutional : Negative for symptoms  Skin: Negative for skin symptoms  Eyes: Negative for eye symptoms  Ear/Nose/Throat : Negative for Ear/Nose/Throat symptoms  Hematologic/Lymphatic: Negative for Hematologic/Lymphatic symptoms  Cardiovascular : Negative for cardiovascular symptoms  Respiratory : Negative for respiratory symptoms  Endocrine: Negative for endocrine symptoms  Musculoskeletal: Negative for musculoskeletal symptoms  Neurological: Negative for neurological symptoms  Psychologic: Negative for psychiatric symptoms

## 2021-04-11 NOTE — Progress Notes (Signed)
04/03/2021 3:27 PM   Tammy Odonnell 08/20/2000 [id number]  Referring provider: Sharilyn Sites, MD [address] [place],  [place] [address]  Followup recurrent UTI   HPI: Tammy Odonnell is a 20yo here for followup for recurrent UTI. She has had 1 UTI in the past year. She uses post coital macrobid which works well. She denies any significant LUTS. No hematuria or dysuria   PMH: Past Medical History:  Diagnosis Date   No pertinent past medical history     Surgical History: Past Surgical History:  Procedure Laterality Date   NO PAST SURGERIES      Home Medications:  Allergies as of 04/03/2021   No Known Allergies      Medication List        Accurate as of April 03, 2021 11:59 PM. If you have any questions, ask your nurse or doctor.          STOP taking these medications    cephALEXin 500 MG capsule Commonly known as: KEFLEX Stopped by: Nicolette Bang, MD       TAKE these medications    nitrofurantoin 50 MG capsule Commonly known as: MACRODANTIN Take 1 capsule (50 mg total) by mouth at bedtime. Started by: Nicolette Bang, MD        Allergies: No Known Allergies  Family History: Family History  Problem Relation Age of Onset   Diabetes Maternal Grandmother     Social History:  reports that she has never smoked. She has never used smokeless tobacco. She reports that she does not currently use alcohol. She reports that she does not currently use drugs.  ROS: All other review of systems were reviewed and are negative except what is noted above in HPI  Physical Exam: BP 125/77   Pulse (!) 105   Constitutional:  Alert and oriented, No acute distress. HEENT: [place] AT, moist mucus membranes.  Trachea midline, no masses. Cardiovascular: No clubbing, cyanosis, or edema. Respiratory: Normal respiratory effort, no increased work of breathing. GI: Abdomen is soft, nontender, nondistended, no abdominal masses GU: No CVA tenderness.  Lymph: No  cervical or inguinal lymphadenopathy. Skin: No rashes, bruises or suspicious lesions. Neurologic: Grossly intact, no focal deficits, moving all 4 extremities. Psychiatric: Normal mood and affect.  Laboratory Data: Lab Results  Component Value Date   WBC 7.8 02/07/2007   HGB 11.1 02/07/2007   HCT 32.7 (L) 02/07/2007   MCV 81.5 02/07/2007   PLT 388 02/07/2007    No results found for: CREATININE  No results found for: PSA  No results found for: TESTOSTERONE  No results found for: HGBA1C  Urinalysis    Component Value Date/Time   APPEARANCEUR Clear 04/03/2021 1502   GLUCOSEU Negative 04/03/2021 1502   BILIRUBINUR Negative 04/03/2021 1502   KETONESUR negative 02/20/2021 0825   PROTEINUR Negative 04/03/2021 1502   UROBILINOGEN 0.2 02/20/2021 0825   NITRITE Negative 04/03/2021 1502   LEUKOCYTESUR Negative 04/03/2021 1502    Lab Results  Component Value Date   LABMICR Comment 04/03/2021   WBCUA 6-10 (A) 03/11/2020   LABEPIT >10 (A) 03/11/2020   BACTERIA Moderate (A) 03/11/2020    Pertinent Imaging:  No results found for this or any previous visit.  No results found for this or any previous visit.  No results found for this or any previous visit.  No results found for this or any previous visit.  No results found for this or any previous visit.  No results found for this or any previous  visit.  No results found for this or any previous visit.  No results found for this or any previous visit.   Assessment & Plan:    1. Recurrent UTI -Continue post coital macrobid - Urinalysis, Routine w reflex microscopic - BLADDER SCAN AMB NON-IMAGING   Return in about 1 year (around 04/03/2022).  Wilkie Aye, MD  [facility] [place]

## 2022-01-11 ENCOUNTER — Other Ambulatory Visit (HOSPITAL_COMMUNITY)
Admission: RE | Admit: 2022-01-11 | Discharge: 2022-01-11 | Disposition: A | Discharge status: Home / Self Care | Payer: 59 | Source: Ambulatory Visit | Attending: Obstetrics & Gynecology | Admitting: Obstetrics & Gynecology

## 2022-01-11 ENCOUNTER — Other Ambulatory Visit (INDEPENDENT_AMBULATORY_CARE_PROVIDER_SITE_OTHER): Payer: 59 | Admitting: *Deleted

## 2022-01-11 DIAGNOSIS — N898 Other specified noninflammatory disorders of vagina: Secondary | ICD-10-CM | POA: Diagnosis present

## 2022-01-11 NOTE — Progress Notes (Signed)
   NURSE VISIT- VAGINITIS/STD/POC  SUBJECTIVE:  Tammy Odonnell is a 21 y.o. G0P0000 GYN patientfemale here for a vaginal swab for vaginitis screening.  She reports the following symptoms: vulvar itching for several days. Denies abnormal vaginal bleeding, significant pelvic pain, fever, or UTI symptoms.  OBJECTIVE:  There were no vitals taken for this visit.  Appears well, in no apparent distress  ASSESSMENT: Vaginal swab for vaginitis screening  PLAN: Self-collected vaginal probe for Gonorrhea, Chlamydia, Trichomonas, Bacterial Vaginosis, Yeast sent to lab Treatment: to be determined once results are received Follow-up as needed if symptoms persist/worsen, or new symptoms develop  Annamarie Dawley  01/11/2022 10:50 AM

## 2022-01-12 LAB — CERVICOVAGINAL ANCILLARY ONLY
Bacterial Vaginitis (gardnerella): POSITIVE — AB
Candida Glabrata: NEGATIVE
Candida Vaginitis: POSITIVE — AB
Chlamydia: NEGATIVE
Comment: NEGATIVE
Comment: NEGATIVE
Comment: NEGATIVE
Comment: NEGATIVE
Comment: NEGATIVE
Comment: NORMAL
Neisseria Gonorrhea: NEGATIVE
Trichomonas: NEGATIVE

## 2022-01-15 ENCOUNTER — Other Ambulatory Visit: Payer: Self-pay | Admitting: Adult Health

## 2022-01-15 MED ORDER — FLUCONAZOLE 150 MG PO TABS: ORAL_TABLET | ORAL | 1 refills | Status: DC

## 2022-01-15 MED ORDER — METRONIDAZOLE 500 MG PO TABS: 500.0000 mg | ORAL_TABLET | Freq: Two times a day (BID) | ORAL | 0 refills | Status: DC

## 2022-01-15 NOTE — Progress Notes (Signed)
+  BV and yeast on vaginal swab, rx sent in for diflucan and flagyl, no sex or alcohol while taking meds.

## 2022-05-09 ENCOUNTER — Other Ambulatory Visit (INDEPENDENT_AMBULATORY_CARE_PROVIDER_SITE_OTHER): Payer: 59 | Admitting: *Deleted

## 2022-05-09 ENCOUNTER — Other Ambulatory Visit (HOSPITAL_COMMUNITY)
Admission: RE | Admit: 2022-05-09 | Discharge: 2022-05-09 | Disposition: A | Discharge status: Home / Self Care | Payer: 59 | Source: Ambulatory Visit | Attending: Obstetrics & Gynecology | Admitting: Obstetrics & Gynecology

## 2022-05-09 DIAGNOSIS — N898 Other specified noninflammatory disorders of vagina: Secondary | ICD-10-CM | POA: Diagnosis present

## 2022-05-09 NOTE — Progress Notes (Signed)
   NURSE VISIT- VAGINITIS/STD  SUBJECTIVE:  SHAROL CROGHAN is a 22 y.o. G0P0000 GYN patientfemale here for a vaginal swab for vaginitis screening, STD screen.  She reports the following symptoms:  vaginal itching & vaginal odor  for 2-3 days. Denies abnormal vaginal bleeding, significant pelvic pain, fever, or UTI symptoms.  OBJECTIVE:  There were no vitals taken for this visit.  Appears well, in no apparent distress  ASSESSMENT: Vaginal swab for vaginitis screening & STD screening.   PLAN: Self-collected vaginal probe for Gonorrhea, Chlamydia, Trichomonas, Bacterial Vaginosis, Yeast sent to lab Treatment: to be determined once results are received Follow-up as needed if symptoms persist/worsen, or new symptoms develop  Levy Pupa  05/09/2022 3:19 PM

## 2022-05-10 ENCOUNTER — Other Ambulatory Visit: Payer: 59

## 2022-05-11 ENCOUNTER — Other Ambulatory Visit: Payer: Self-pay | Admitting: Adult Health

## 2022-05-11 LAB — CERVICOVAGINAL ANCILLARY ONLY
Bacterial Vaginitis (gardnerella): POSITIVE — AB
Candida Glabrata: NEGATIVE
Candida Vaginitis: NEGATIVE
Chlamydia: NEGATIVE
Comment: NEGATIVE
Comment: NEGATIVE
Comment: NEGATIVE
Comment: NEGATIVE
Comment: NEGATIVE
Comment: NORMAL
Neisseria Gonorrhea: NEGATIVE
Trichomonas: NEGATIVE

## 2022-05-11 MED ORDER — METRONIDAZOLE 500 MG PO TABS: 500.0000 mg | ORAL_TABLET | Freq: Two times a day (BID) | ORAL | 0 refills | Status: DC

## 2022-05-11 NOTE — Progress Notes (Signed)
+  BV on vaginal swab, will rx flagyl, no sex or alcohol while taking  

## 2023-06-12 ENCOUNTER — Encounter: Payer: Self-pay | Admitting: Obstetrics & Gynecology

## 2023-06-12 ENCOUNTER — Other Ambulatory Visit (HOSPITAL_COMMUNITY)
Admission: RE | Admit: 2023-06-12 | Discharge: 2023-06-12 | Disposition: A | Discharge status: Home / Self Care | Payer: 59 | Source: Ambulatory Visit | Attending: Obstetrics & Gynecology | Admitting: Obstetrics & Gynecology

## 2023-06-12 ENCOUNTER — Ambulatory Visit: Payer: 59 | Admitting: Obstetrics & Gynecology

## 2023-06-12 VITALS — BP 130/80 | HR 77 | Ht 67.0 in | Wt 143.2 lb

## 2023-06-12 DIAGNOSIS — Z124 Encounter for screening for malignant neoplasm of cervix: Secondary | ICD-10-CM | POA: Diagnosis present

## 2023-06-12 DIAGNOSIS — Z30018 Encounter for initial prescription of other contraceptives: Secondary | ICD-10-CM

## 2023-06-12 DIAGNOSIS — N762 Acute vulvitis: Secondary | ICD-10-CM | POA: Diagnosis not present

## 2023-06-12 MED ORDER — PHEXXI 1.8-1-0.4 % VA GEL: VAGINAL | 11 refills | Status: DC

## 2023-06-12 MED ORDER — TRIAMCINOLONE ACETONIDE 0.5 % EX OINT: 1.0000 | TOPICAL_OINTMENT | Freq: Two times a day (BID) | CUTANEOUS | 0 refills | Status: AC

## 2023-06-12 NOTE — Addendum Note (Signed)
 Addended by: Christel Cousins on: 06/12/2023 04:31 PM   Modules accepted: Orders

## 2023-06-12 NOTE — Progress Notes (Signed)
   GYN VISIT Patient name: Tammy Odonnell MRN [id number]  Date of birth: 08-Jul-2000 Chief Complaint:   Rash  History of Present Illness:   Tammy Odonnell is a 23 y.o. G0P0000 female being seen today for the following concerns:  Vulvar rash: She notes rash on her groin- itching for over a month.  Only recently noted inflammation and rash this past week.    Recently has changed body wash, but doesn't necessarily have exposure to where the area is irritated.  Reports no other environmental changes  Same partner- mostly condoms She does not desires a pregnancy  Tried pill and Depot- noted vaignal dryness and significant weight gain with Depot. Menses are regular each month-      Patient's last menstrual period was 05/30/2023.  Review of Systems:   Pertinent items are noted in HPI Denies fever/chills, dizziness, headaches, visual disturbances, fatigue, shortness of breath, chest pain, abdominal pain, vomiting, no problems with periods, bowel movements, urination, or intercourse unless otherwise stated above.  Pertinent History Reviewed:   Past Surgical History:  Procedure Laterality Date   NO PAST SURGERIES      Past Medical History:  Diagnosis Date   No pertinent past medical history    Reviewed problem list, medications and allergies. Physical Assessment:   Vitals:   06/12/23 0927  BP: 130/80  Pulse: 77  Weight: 143 lb 3.2 oz (65 kg)  Height: 5' 7 (1.702 m)  Body mass index is 22.43 kg/m.       Physical Examination:   General appearance: alert, well appearing, and in no distress  Psych: mood appropriate, normal affect  Skin: warm & dry   Cardiovascular: normal heart rate noted  Respiratory: normal respiratory effort, no distress  Abdomen: soft, non-tender   Pelvic: VULVA: Bilateral groin area and extending to labia- diffuse flat light pink irregular appearing rash noted, no tenderness or lesions, VAGINA: normal appearing vagina with normal color and discharge,  no lesions, CERVIX: normal appearing cervix without discharge or lesions  Extremities: no edema   Chaperone: Alan Fischer    Assessment & Plan:  1) Acute vulvitis -seems like dermatitis/irritation from shaving -reviewed conservative measures- new razor, no shaving at least for 2 weeks -low dose steroid ointment sent in for immediate irritation -if no improvement RTC  2) Cervical cancer screening -Reviewed ASCCP guidelines, Pap collected today  3) contraceptive management -Discussed nonhormonal options -Patient may try Phexxi , Rx sent in   Meds ordered this encounter  Medications   Lactic Ac-Citric Ac-Pot Bitart (PHEXXI ) 1.8-1-0.4 % GEL    Sig: 1 applicatorful per vagina before intercourse    Dispense:  5 g    Refill:  11      Return in about 1 year (around 06/11/2024) for Annual.   Malley Hauter, DO Attending Obstetrician & Gynecologist, [facility] for [facility], [facility]

## 2023-06-18 LAB — CYTOLOGY - PAP
Chlamydia: NEGATIVE
Comment: NEGATIVE
Comment: NORMAL
Diagnosis: NEGATIVE
Neisseria Gonorrhea: NEGATIVE

## 2023-06-19 ENCOUNTER — Encounter: Payer: Self-pay | Admitting: Obstetrics & Gynecology

## 2024-02-05 ENCOUNTER — Encounter: Payer: Self-pay | Admitting: Adult Health

## 2024-02-05 ENCOUNTER — Ambulatory Visit (INDEPENDENT_AMBULATORY_CARE_PROVIDER_SITE_OTHER): Admitting: Adult Health

## 2024-02-05 VITALS — BP 136/82 | HR 78 | Ht 68.0 in | Wt 151.5 lb

## 2024-02-05 DIAGNOSIS — Z3202 Encounter for pregnancy test, result negative: Secondary | ICD-10-CM

## 2024-02-05 DIAGNOSIS — Z30011 Encounter for initial prescription of contraceptive pills: Secondary | ICD-10-CM

## 2024-02-05 LAB — POCT URINE PREGNANCY: Preg Test, Ur: NEGATIVE

## 2024-02-05 MED ORDER — NORETHINDRONE ACET-ETHINYL EST 1-20 MG-MCG PO TABS: 1.0000 | ORAL_TABLET | Freq: Every day | ORAL | 3 refills | Status: DC

## 2024-02-05 NOTE — Progress Notes (Signed)
  Subjective:     Patient ID: Tammy Odonnell, female   DOB: 06/15/2000, 23 y.o.   MRN: [id number]  HPI [name] is a 23 year old white female,single, G0P0, in wanting to discuss getting back on birth control and she wants a pill. She has tried several pills in the past and depo. Had UTIs when on depo.     Component Value Date/Time   DIAGPAP  06/12/2023 0943    - Negative for intraepithelial lesion or malignancy (NILM)   ADEQPAP  06/12/2023 0943    Satisfactory for evaluation; transformation zone component PRESENT.    PCP is Dr Marvine Review of Systems Denies MI,stroke,DVT, breast cancer or migraines with aura Reviewed past medical,surgical, social and family history. Reviewed medications and allergies.     Objective:   Physical Exam BP 136/82 (BP Location: Right Arm, Patient Position: Sitting, Cuff Size: Normal)   Pulse 78   Ht 5' 8 (1.727 m)   Wt 151 lb 8 oz (68.7 kg)   LMP 01/05/2024 (Exact Date)   BMI 23.04 kg/m  UPT is negative Skin warm and dry. Lungs: clear to ausculation bilaterally. Cardiovascular: regular rate and rhythm.    Fall risk is low  Upstream - 02/05/24 1134       Pregnancy Intention Screening   Does the patient want to become pregnant in the next year? No    Does the patient's partner want to become pregnant in the next year? No    Would the patient like to discuss contraceptive options today? Yes      Contraception Wrap Up   Current Method Female Condom    End Method Oral Contraceptive;Female Condom    Contraception Counseling Provided Yes    How was the end contraceptive method provided? Prescription          Assessment:     1. Negative pregnancy test - POCT urine pregnancy  2. Encounter for initial prescription of contraceptive pills (Primary) Will rx junel 1/20, can start today, use condoms for 1 pack Meds ordered this encounter  Medications   norethindrone-ethinyl estradiol  (LOESTRIN ) 1-20 MG-MCG tablet    Sig: Take 1 tablet by mouth daily.     Dispense:  84 tablet    Refill:  3    Supervising Provider:   JAYNE MINDER H [[id number]]   Follow up in 3 months for ROS     Plan:     Follow up in 3 months for ROS

## 2024-05-11 ENCOUNTER — Encounter: Payer: Self-pay | Admitting: Adult Health

## 2024-05-11 ENCOUNTER — Ambulatory Visit: Admitting: Adult Health

## 2024-05-11 VITALS — BP 125/79 | HR 82 | Ht 68.0 in | Wt 157.5 lb

## 2024-05-11 DIAGNOSIS — Z760 Encounter for issue of repeat prescription: Secondary | ICD-10-CM

## 2024-05-11 DIAGNOSIS — Z3041 Encounter for surveillance of contraceptive pills: Secondary | ICD-10-CM | POA: Diagnosis not present

## 2024-05-11 MED ORDER — NORETHIN ACE-ETH ESTRAD-FE 1-20 MG-MCG(24) PO TABS: 1.0000 | ORAL_TABLET | Freq: Every day | ORAL | 4 refills | Status: AC

## 2024-05-11 NOTE — Progress Notes (Signed)
" °  Subjective:     Patient ID: Tammy Odonnell, female   DOB: 05-Sep-2000, 24 y.o.   MRN: [id number]  HPI Tammy Odonnell is a 24 year old white female, single, G0P0, back in follow up on starting Loestrin  1-20, she still doing well with them but wants 28 day pack.      Component Value Date/Time   DIAGPAP  06/12/2023 0943    - Negative for intraepithelial lesion or malignancy (NILM)   ADEQPAP  06/12/2023 0943    Satisfactory for evaluation; transformation zone component PRESENT.   PCP is Dr Shona. Review of Systems Denies any headaches, chest pain, or problems with BM or urination Periods good Reviewed past medical,surgical, social and family history. Reviewed medications and allergies.     Objective:   Physical Exam BP 125/79 (BP Location: Right Arm, Patient Position: Sitting, Cuff Size: Normal)   Pulse 82   Ht 5' 8 (1.727 m)   Wt 157 lb 8 oz (71.4 kg)   LMP 04/25/2024 (Exact Date)   BMI 23.95 kg/m     Skin warm and dry. Lungs: clear to ausculation bilaterally. Cardiovascular: regular rate and rhythm.   Upstream - 05/11/24 1119       Pregnancy Intention Screening   Does the patient want to become pregnant in the next year? No    Does the patient's partner want to become pregnant in the next year? No    Would the patient like to discuss contraceptive options today? Yes      Contraception Wrap Up   Current Method Oral Contraceptive    End Method Oral Contraceptive    Contraception Counseling Provided Yes          Assessment:     1. Encounter for surveillance of contraceptive pills (Primary) Wants 28 day pill Will rx loestrin  24 Fe, finish current pack of pills and then start these, use condoms Meds ordered this encounter  Medications   Norethindrone  Acetate-Ethinyl Estrad-FE (LOESTRIN  24 FE) 1-20 MG-MCG(24) tablet    Sig: Take 1 tablet by mouth daily.    Dispense:  84 tablet    Refill:  4    Supervising Provider:   JAYNE VONN DEL [[id number]]        Plan:     Return in 3  months for physical and ROS     "

## 2024-08-12 ENCOUNTER — Ambulatory Visit: Admitting: Adult Health
